# Patient Record
Sex: Female | Born: 1995 | Hispanic: No | Marital: Single | Smoking: Never smoker
Health system: Southern US, Community
[De-identification: ages and names within clinical notes are randomized; demographics above are authoritative.]

---
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
## 2016-07-20 ENCOUNTER — Emergency Department (INDEPENDENT_AMBULATORY_CARE_PROVIDER_SITE_OTHER): Payer: BLUE CROSS/BLUE SHIELD

## 2016-07-20 ENCOUNTER — Encounter: Payer: Self-pay | Admitting: Emergency Medicine

## 2016-07-20 ENCOUNTER — Emergency Department (INDEPENDENT_AMBULATORY_CARE_PROVIDER_SITE_OTHER)
Admission: EM | Admit: 2016-07-20 | Discharge: 2016-07-20 | Disposition: A | Discharge status: Home / Self Care | Payer: BLUE CROSS/BLUE SHIELD | Source: Home / Self Care | Attending: Family Medicine | Admitting: Family Medicine

## 2016-07-20 DIAGNOSIS — J069 Acute upper respiratory infection, unspecified: Secondary | ICD-10-CM

## 2016-07-20 DIAGNOSIS — B9789 Other viral agents as the cause of diseases classified elsewhere: Secondary | ICD-10-CM

## 2016-07-20 DIAGNOSIS — F17210 Nicotine dependence, cigarettes, uncomplicated: Secondary | ICD-10-CM

## 2016-07-20 DIAGNOSIS — R05 Cough: Secondary | ICD-10-CM | POA: Diagnosis not present

## 2016-07-20 LAB — POCT RAPID STREP A (OFFICE): Rapid Strep A Screen: NEGATIVE

## 2016-07-20 MED ORDER — AZITHROMYCIN 250 MG PO TABS: ORAL_TABLET | ORAL | 0 refills | Status: AC

## 2016-07-20 NOTE — Discharge Instructions (Signed)
Take plain guaifenesin (1200mg extended release tabs such as Mucinex) twice daily, with plenty of water, for cough and congestion.  May add Pseudoephedrine (30mg, one or two every 4 to 6 hours) for sinus congestion.  Get adequate rest.   °May use Afrin nasal spray (or generic oxymetazoline) each morning for about 5 days and then discontinue.  Also recommend using saline nasal spray several times daily and saline nasal irrigation (AYR is a common brand).   °Try warm salt water gargles for sore throat.  °Stop all antihistamines for now, and other non-prescription cough/cold preparations. °May take Ibuprofen 200mg, 4 tabs every 8 hours with food for chest/sternum discomfort. °May take Delsym Cough Suppressant at bedtime for nighttime cough.  °Begin Azithromycin if not improving about one week or if persistent fever develops   °Follow-up with family doctor if not improving about10 days.  °

## 2016-07-20 NOTE — ED Provider Notes (Signed)
[facility]    CSN: [id number] Arrival date & time: 07/20/16  1444     History   Chief Complaint Chief Complaint  Patient presents with  . Sore Throat    HPI Lindsey Kent is a 21 y.o. female.   Patient awoke yesterday with sore throat, sinus congestion, and fatigue.  Last night she developed cough and tightness in her anterior chest.  Her ears feel full.  She is a smoker. She has a family history of asthma (sister and mother).    The history is provided by the patient.    History reviewed. No pertinent past medical history.  There are no active problems to display for this patient.   History reviewed. No pertinent surgical history.  OB History    No data available       Home Medications    Prior to Admission medications   Medication Sig Start Date End Date Taking? Authorizing Provider  norgestimate-ethinyl estradiol (ORTHO-CYCLEN,SPRINTEC,PREVIFEM) 0.25-35 MG-MCG tablet Take 1 tablet by mouth daily.   Yes [provider]  propranolol (INDERAL) 10 MG tablet Take 10 mg by mouth 3 (three) times daily.   Yes [provider]  azithromycin (ZITHROMAX Z-PAK) 250 MG tablet Take 2 tabs today; then begin one tab once daily for 4 more days. (Rx void after 07/28/16) 07/20/16   Lattie Haw, MD    Family History History reviewed. No pertinent family history.  Social History Social History  Substance Use Topics  . Smoking status: Never Smoker  . Smokeless tobacco: Never Used  . Alcohol use No     Allergies   Patient has no known allergies.   Review of Systems Review of Systems + sore throat + cough + pain in anterior chest No wheezing + nasal congestion + post-nasal drainage No sinus pain/pressure No itchy/red eyes ? earache No hemoptysis No SOB No fever, but felt hot No nausea No vomiting No abdominal pain No diarrhea No urinary symptoms No skin rash + fatigue No myalgias No headache    Physical  Exam Triage Vital Signs ED Triage Vitals  Enc Vitals Group     BP 07/20/16 1511 135/88     Pulse Rate 07/20/16 1511 70     Resp 07/20/16 1511 18     Temp 07/20/16 1511 98.7 F (37.1 C)     Temp Source 07/20/16 1511 Oral     SpO2 07/20/16 1511 99 %     Weight 07/20/16 1511 142 lb (64.4 kg)     Height 07/20/16 1511 5\' 7"  (1.702 m)     Head Circumference --      Peak Flow --      Pain Score 07/20/16 1512 2     Pain Loc --      Pain Edu? --      Excl. in GC? --    No data found.   Updated Vital Signs BP 135/88 (BP Location: Left Arm)   Pulse 70   Temp 98.7 F (37.1 C) (Oral)   Resp 18   Ht 5\' 7"  (1.702 m)   Wt 142 lb (64.4 kg)   LMP 07/03/2016 (Exact Date)   SpO2 99%   BMI 22.24 kg/m   Visual Acuity Right Eye Distance:   Left Eye Distance:   Bilateral Distance:    Right Eye Near:   Left Eye Near:    Bilateral Near:     Physical Exam Nursing notes and Vital Signs reviewed. Appearance:  Patient appears stated  age, and in no acute distress Eyes:  Pupils are equal, round, and reactive to light and accomodation.  Extraocular movement is intact.  Conjunctivae are not inflamed  Ears:  Canals normal.  Tympanic membranes normal.  Nose:  Mildly congested turbinates.  No sinus tenderness.    Pharynx:  Minimal erythema. Neck:  Supple.  Enlarged posterior/lateral nodes are palpated bilaterally, tender to palpation on the left.   Lungs:  Clear to auscultation.  Breath sounds are equal.  Moving air well. Chest:  Tenderness to palpation over the mid-sternum.  Heart:  Regular rate and rhythm without murmurs, rubs, or gallops.  Abdomen:  Nontender without masses or hepatosplenomegaly.  Bowel sounds are present.  No CVA or flank tenderness.  Extremities:  No edema.  Skin:  No rash present.    UC Treatments / Results  Labs (all labs ordered are listed, but only abnormal results are displayed) Labs Reviewed  POCT RAPID STREP A (OFFICE) negative    EKG  EKG  Interpretation None       Radiology Dg Chest 2 View  Result Date: 07/20/2016 CLINICAL DATA:  Pt states that since yesterday she has had a cough with a sore throat and central chest pressure. Smokes 1pkpd. EXAM: CHEST  2 VIEW COMPARISON:  None. FINDINGS: Normal mediastinum and cardiac silhouette. Normal pulmonary vasculature. No evidence of effusion, infiltrate, or pneumothorax. No acute bony abnormality. IMPRESSION: Normal chest radiograph Electronically Signed   By: Genevive BiStewart  Edmunds M.D.   On: 07/20/2016 15:30    Procedures Procedures (including critical care time)  Medications Ordered in UC Medications - No data to display   Initial Impression / Assessment and Plan / UC Course  I have reviewed the triage vital signs and the nursing notes.  Pertinent labs & imaging results that were available during my care of the patient were reviewed by me and considered in my medical decision making (see chart for details).    There is no evidence of bacterial infection today.   Treat symptomatically for now  Take plain guaifenesin (1200mg  extended release tabs such as Mucinex) twice daily, with plenty of water, for cough and congestion.  May add Pseudoephedrine (30mg , one or two every 4 to 6 hours) for sinus congestion.  Get adequate rest.   May use Afrin nasal spray (or generic oxymetazoline) each morning for about 5 days and then discontinue.  Also recommend using saline nasal spray several times daily and saline nasal irrigation (AYR is a common brand).   Try warm salt water gargles for sore throat.  Stop all antihistamines for now, and other non-prescription cough/cold preparations. May take Ibuprofen 200mg , 4 tabs every 8 hours with food for chest/sternum discomfort. May take Delsym Cough Suppressant at bedtime for nighttime cough.  Begin Azithromycin if not improving about one week or if persistent fever develops (Given a prescription to hold, with an expiration date)  Follow-up with  family doctor if not improving about10 days.     Final Clinical Impressions(s) / UC Diagnoses   Final diagnoses:  Viral URI with cough    New Prescriptions New Prescriptions   AZITHROMYCIN (ZITHROMAX Z-PAK) 250 MG TABLET    Take 2 tabs today; then begin one tab once daily for 4 more days. (Rx void after 07/28/16)     Lattie HawBeese, Lamont Tant A, MD 07/21/16 1145

## 2016-07-20 NOTE — ED Triage Notes (Signed)
[facility] with a complaint of sore throat, cough and congestion x 1 day.

## 2017-10-03 ENCOUNTER — Encounter: Payer: Self-pay | Admitting: Emergency Medicine

## 2017-10-03 ENCOUNTER — Other Ambulatory Visit: Payer: Self-pay

## 2017-10-03 ENCOUNTER — Emergency Department
Admission: EM | Admit: 2017-10-03 | Discharge: 2017-10-03 | Disposition: A | Discharge status: Home / Self Care | Payer: BLUE CROSS/BLUE SHIELD | Source: Home / Self Care | Attending: Family Medicine | Admitting: Family Medicine

## 2017-10-03 DIAGNOSIS — R002 Palpitations: Secondary | ICD-10-CM

## 2017-10-03 DIAGNOSIS — R0981 Nasal congestion: Secondary | ICD-10-CM | POA: Diagnosis not present

## 2017-10-03 DIAGNOSIS — O219 Vomiting of pregnancy, unspecified: Secondary | ICD-10-CM

## 2017-10-03 DIAGNOSIS — R05 Cough: Secondary | ICD-10-CM

## 2017-10-03 DIAGNOSIS — R059 Cough, unspecified: Secondary | ICD-10-CM

## 2017-10-03 NOTE — ED Provider Notes (Signed)
[facility]    CSN: [id number] Arrival date & time: 10/03/17  1210     History   Chief Complaint Chief Complaint  Patient presents with  . Nasal Congestion  . Cough  . Emesis    HPI Lindsey Kent is a 22 y.o. female.   HPI  Lindsey Kent is a 22 y.o. female who is [redacted] weeks pregnant presenting to UC with c/o nausea, vomiting, cough, congestion, and generalized HA.  Symptoms gradually worsening over the last 1 week.  She has not taken any medication PTA.  She has vomited about 7 times in the last 24 hours. Mild lower abdominal cramping. She also has had mild intermittent palpitations. She tried calling her OB/GYN but was unable to contact them. She had previously been advised to go to [facility] if she developed palpitations but pt drove to [facility] today with her friend instead.    History reviewed. No pertinent past medical history.  There are no active problems to display for this patient.   History reviewed. No pertinent surgical history.  OB History    Gravida  1   Para      Term      Preterm      AB      Living        SAB      TAB      Ectopic      Multiple      Live Births               Home Medications    Prior to Admission medications   Medication Sig Start Date End Date Taking? Authorizing Provider  azithromycin (ZITHROMAX Z-PAK) 250 MG tablet Take 2 tabs today; then begin one tab once daily for 4 more days. (Rx void after 07/28/16) 07/20/16   Lattie Haw, MD  norgestimate-ethinyl estradiol (ORTHO-CYCLEN,SPRINTEC,PREVIFEM) 0.25-35 MG-MCG tablet Take 1 tablet by mouth daily.    [provider]  propranolol (INDERAL) 10 MG tablet Take 10 mg by mouth 3 (three) times daily.    [provider]    Family History History reviewed. No pertinent family history.  Social History Social History   Tobacco Use  . Smoking status: Never Smoker  . Smokeless tobacco: Never Used  Substance Use Topics  . Alcohol  use: No  . Drug use: No     Allergies   Patient has no known allergies.   Review of Systems Review of Systems  Constitutional: Negative for chills and fever.  HENT: Positive for congestion and postnasal drip. Negative for sinus pressure, sneezing and sore throat.   Respiratory: Positive for cough.   Gastrointestinal: Positive for abdominal pain, nausea and vomiting.  Genitourinary: Negative for dysuria, frequency and urgency.  Musculoskeletal: Negative for back pain.  Neurological: Positive for headaches. Negative for dizziness and light-headedness.     Physical Exam Triage Vital Signs ED Triage Vitals  Enc Vitals Group     BP 10/03/17 1234 134/84     Pulse Rate 10/03/17 1234 91     Resp --      Temp 10/03/17 1234 98.5 F (36.9 C)     Temp Source 10/03/17 1234 Oral     SpO2 10/03/17 1234 99 %     Weight 10/03/17 1235 142 lb 12.8 oz (64.8 kg)     Height 10/03/17 1235 5\' 7"  (1.702 m)     Head Circumference --      Peak Flow --  Pain Score 10/03/17 1235 6     Pain Loc --      Pain Edu? --      Excl. in GC? --    No data found.  Updated Vital Signs BP 134/84 (BP Location: Right Arm)   Pulse 91   Temp 98.5 F (36.9 C) (Oral)   Ht 5\' 7"  (1.702 m)   Wt 142 lb 12.8 oz (64.8 kg)   SpO2 99%   BMI 22.37 kg/m   Visual Acuity Right Eye Distance:   Left Eye Distance:   Bilateral Distance:    Right Eye Near:   Left Eye Near:    Bilateral Near:     Physical Exam  Constitutional: She is oriented to person, place, and time. She appears well-developed and well-nourished. No distress.  HENT:  Head: Normocephalic and atraumatic.  Right Ear: Tympanic membrane normal.  Left Ear: Tympanic membrane normal.  Nose: Mucosal edema present. Right sinus exhibits no maxillary sinus tenderness and no frontal sinus tenderness. Left sinus exhibits no maxillary sinus tenderness and no frontal sinus tenderness.  Mouth/Throat: Uvula is midline, oropharynx is clear and moist and  mucous membranes are normal.  Eyes: EOM are normal.  Neck: Normal range of motion. Neck supple.  Cardiovascular: Normal rate and regular rhythm.  Pulmonary/Chest: Effort normal. No stridor. No respiratory distress. She has no wheezes. She has no rales.  Abdominal: Soft. There is no tenderness. There is no CVA tenderness.  Musculoskeletal: Normal range of motion.  Lymphadenopathy:    She has no cervical adenopathy.  Neurological: She is alert and oriented to person, place, and time.  Skin: Skin is warm and dry. She is not diaphoretic.  Psychiatric: She has a normal mood and affect. Her behavior is normal.  Nursing note and vitals reviewed.    UC Treatments / Results  Labs (all labs ordered are listed, but only abnormal results are displayed) Labs Reviewed - No data to display  EKG None  Radiology No results found.  Procedures Procedures (including critical care time)  Medications Ordered in UC Medications - No data to display  Initial Impression / Assessment and Plan / UC Course  I have reviewed the triage vital signs and the nursing notes.  Pertinent labs & imaging results that were available during my care of the patient were reviewed by me and considered in my medical decision making (see chart for details).     Pt appears well, however, due to reports of vomiting 7 times in the last 24 hours and palpitations with prior recommendations from her OB/GYN to f/u with [facility], [facility] Offered to call EMS, pt declined stating she was going to drive home with her friend so her mother could take her to [facility]. Pt stable for discharge home POV to f/u at [facility] today. Final Clinical Impressions(s) / UC Diagnoses   Final diagnoses:  Nasal congestion  Cough  Nausea and vomiting in pregnancy  Palpitations     Discharge Instructions      Please have a family member or friend bring you to [facility] or call  [phone] if you decide you do want to take an ambulance for further evaluation and treatment of your symptoms.  They will be able to give IV fluids and run any tests including checking your urine for infection while you are there.     ED Prescriptions    None     Controlled Substance Prescriptions [place] Controlled Substance  Registry consulted? Not Applicable   Rolla Plate 10/03/17 1610

## 2017-10-03 NOTE — Discharge Instructions (Signed)
°  Please have a family member or friend bring you to [facility] or call [phone] if you decide you do want to take an ambulance for further evaluation and treatment of your symptoms.  They will be able to give IV fluids and run any tests including checking your urine for infection while you are there.

## 2017-10-03 NOTE — ED Triage Notes (Signed)
Pt presents today c/o with cough, congestion, headache and vomiting. States she is almost [redacted] weeks pregnant and the symptoms started one week ago. States she has not taken anything for the symptoms.

## 2018-12-07 IMAGING — DX DG CHEST 2V
2 series · 2 of 2 positions shown · non-contrast
Comparison: None.

CLINICAL DATA: Pt states that since yesterday she has had a cough
with a sore throat and central chest pressure. Smokes 3pkpd.

EXAM:
CHEST  2 VIEW

[chest pa]
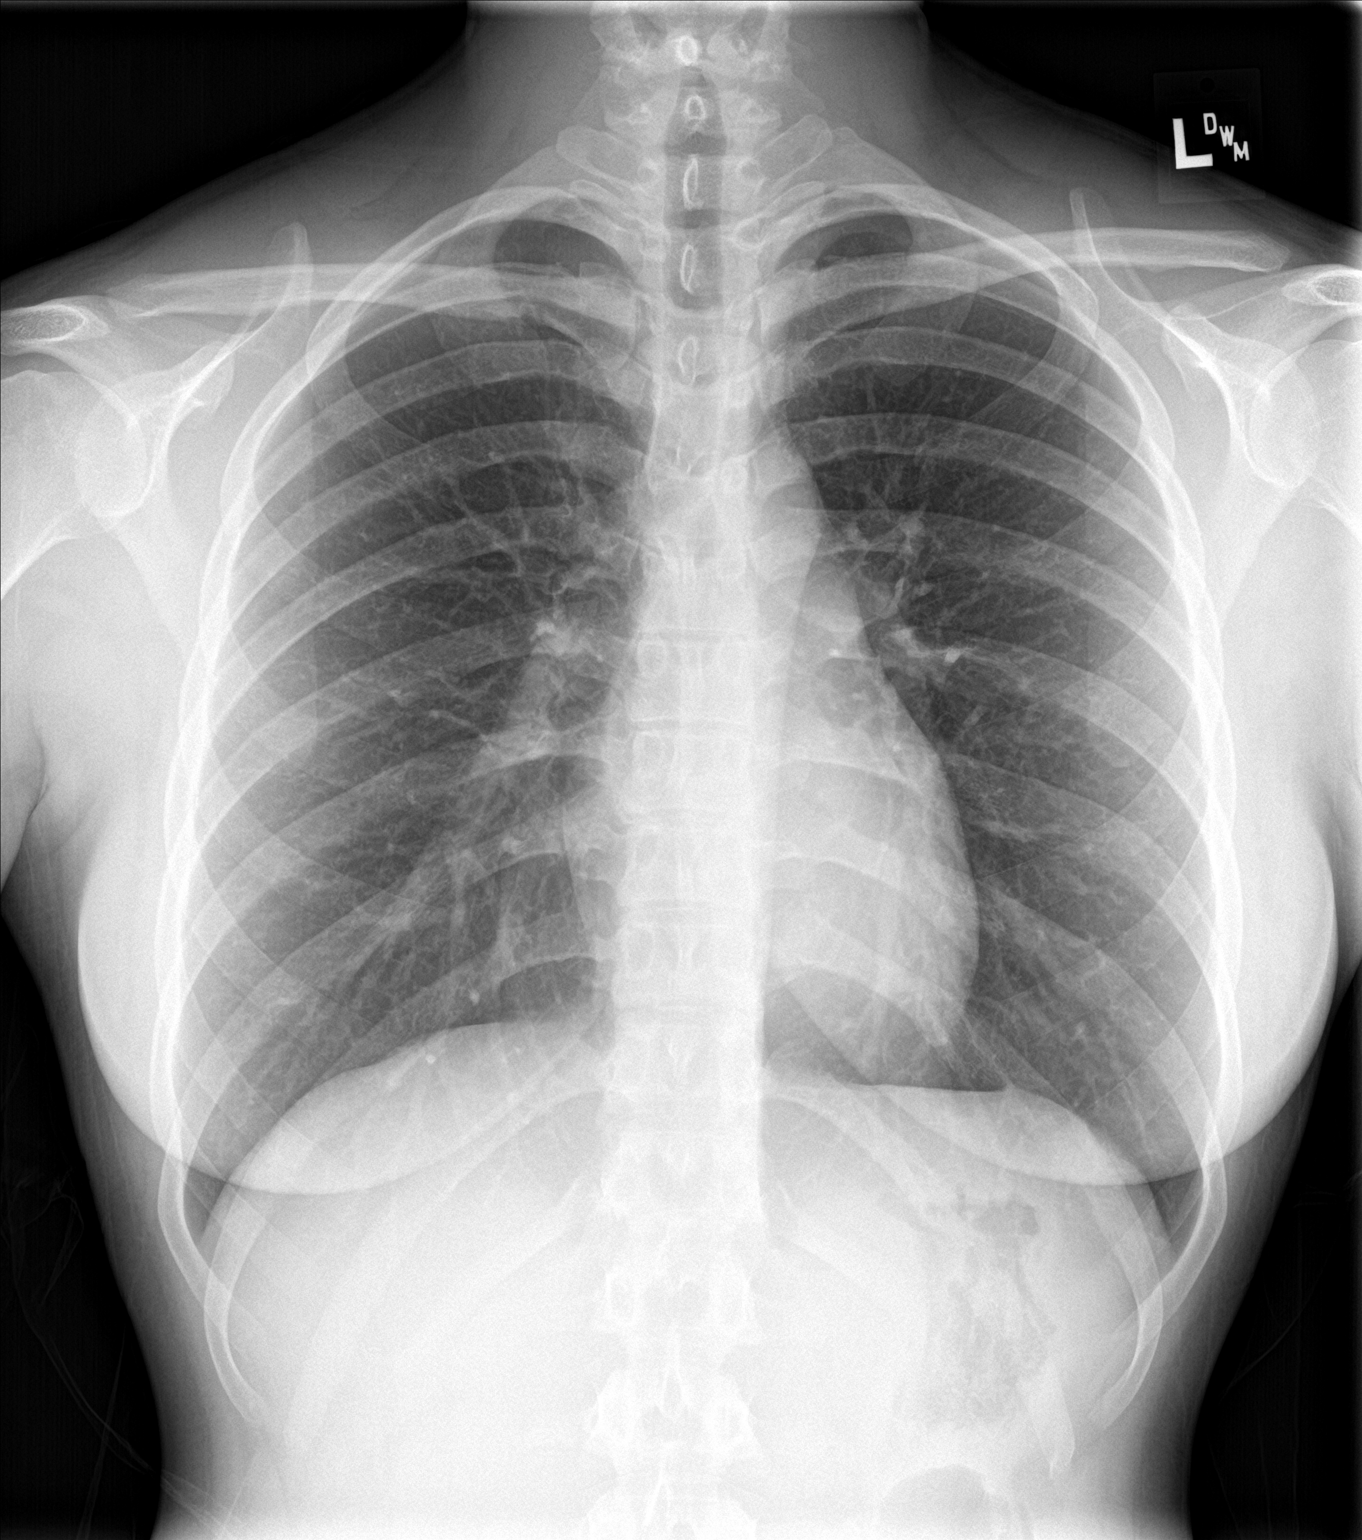

[chest lat]
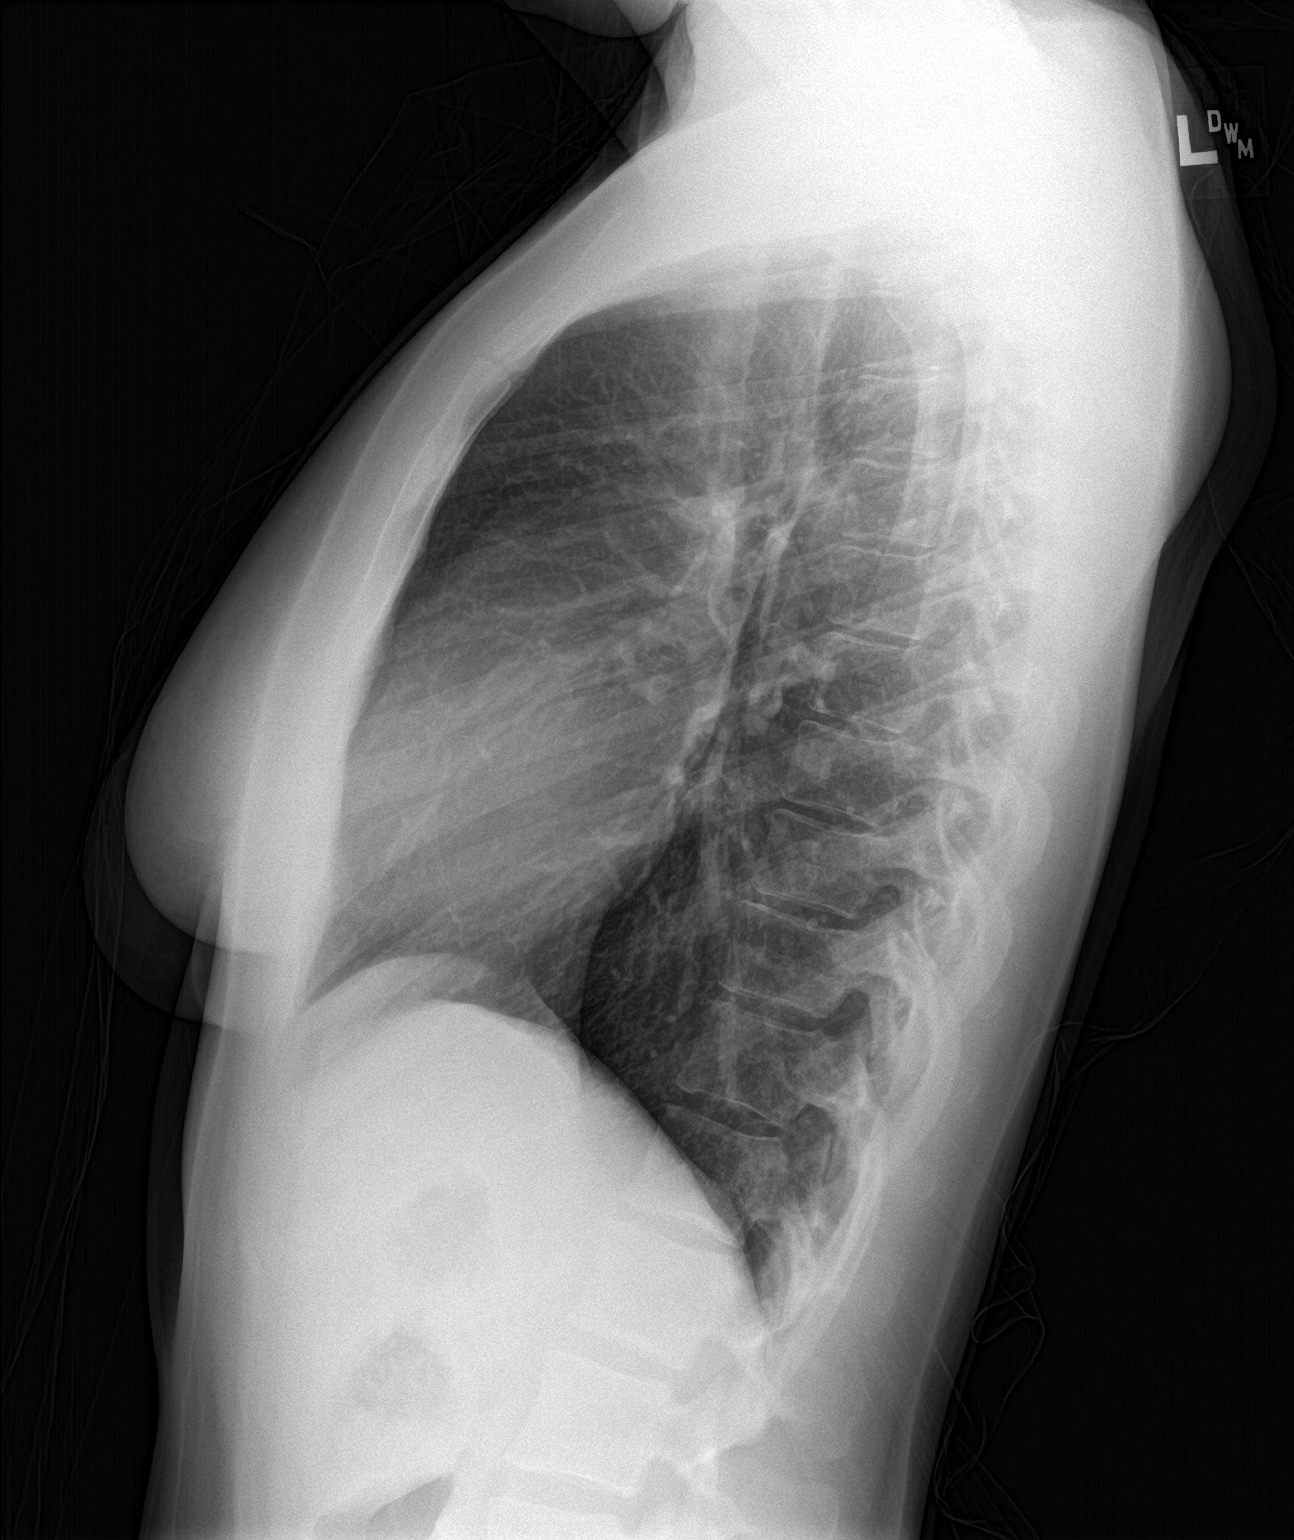

[2 of 2 positions shown; findings below may reference images not displayed]

FINDINGS: Normal mediastinum and cardiac silhouette. Normal pulmonary
vasculature. No evidence of effusion, infiltrate, or pneumothorax.
No acute bony abnormality.
IMPRESSION: Normal chest radiograph
